# Patient Record
Sex: Male | Born: 1990 | Hispanic: Yes | Marital: Single | Smoking: Former smoker
Health system: Southern US, Community
[De-identification: ages and names within clinical notes are randomized; demographics above are authoritative.]

## PROBLEM LIST (undated history)

## (undated) DIAGNOSIS — E7800 Pure hypercholesterolemia, unspecified: Secondary | ICD-10-CM

## (undated) DIAGNOSIS — I10 Essential (primary) hypertension: Secondary | ICD-10-CM

---

## 2015-08-19 ENCOUNTER — Emergency Department
Admission: EM | Admit: 2015-08-19 | Discharge: 2015-08-19 | Disposition: A | Discharge status: Home / Self Care | Payer: Self-pay | Attending: Emergency Medicine | Admitting: Emergency Medicine

## 2015-08-19 ENCOUNTER — Encounter: Payer: Self-pay | Admitting: Emergency Medicine

## 2015-08-19 ENCOUNTER — Emergency Department: Payer: Self-pay

## 2015-08-19 DIAGNOSIS — R0789 Other chest pain: Secondary | ICD-10-CM | POA: Insufficient documentation

## 2015-08-19 DIAGNOSIS — F17200 Nicotine dependence, unspecified, uncomplicated: Secondary | ICD-10-CM | POA: Insufficient documentation

## 2015-08-19 LAB — CBC
HEMATOCRIT: 46.1 % (ref 40.0–52.0)
Hemoglobin: 16.3 g/dL (ref 13.0–18.0)
MCH: 30.4 pg (ref 26.0–34.0)
MCHC: 35.4 g/dL (ref 32.0–36.0)
MCV: 86.1 fL (ref 80.0–100.0)
Platelets: 164 10*3/uL (ref 150–440)
RBC: 5.35 MIL/uL (ref 4.40–5.90)
RDW: 13.3 % (ref 11.5–14.5)
WBC: 7.9 10*3/uL (ref 3.8–10.6)

## 2015-08-19 LAB — BASIC METABOLIC PANEL
Anion gap: 6 (ref 5–15)
BUN: 16 mg/dL (ref 6–20)
CO2: 28 mmol/L (ref 22–32)
Calcium: 10.3 mg/dL (ref 8.9–10.3)
Chloride: 106 mmol/L (ref 101–111)
Creatinine, Ser: 0.86 mg/dL (ref 0.61–1.24)
GFR calc Af Amer: >60 mL/min (ref >60)
GLUCOSE: 95 mg/dL (ref 65–99)
POTASSIUM: 4.4 mmol/L (ref 3.5–5.1)
Sodium: 140 mmol/L (ref 135–145)

## 2015-08-19 LAB — TROPONIN I: Troponin I: <0.03 ng/mL (ref <0.03)

## 2015-08-19 MED ORDER — NAPROXEN 500 MG PO TABS: 500.0000 mg | ORAL_TABLET | Freq: Two times a day (BID) | ORAL | 0 refills | Status: DC

## 2015-08-19 NOTE — ED Notes (Signed)
Awaiting interpreter

## 2015-08-19 NOTE — ED Notes (Signed)
Waiting for interpreter for discharge

## 2015-08-19 NOTE — ED Provider Notes (Signed)
[facility] Emergency Department Provider Note  ____________________________________________  Time seen: Approximately 3:49 PM  I have reviewed the triage vital signs and the nursing notes.   HISTORY  Chief Complaint Chest Pain  Level 5 caveat:  Portions of the history and physical were unable to be obtained due to the patient's poor historian Interviewed and examined with Spanish interpreter   HPI Zachary Beck Fiscal is a 25 y.o. male complains of constant chest pain that is sharp and worse with breathing and movement for the past 6 hours. Gradual onset. He was at work Surveyor, mining at the time of onset. He does repetitive arm motions and lives heavy objects above his head frequently while working at [employer]. Denies any exertional symptoms. Does report some palpitations, no vomiting or significant diaphoresis. No specific shortness of breath.     History reviewed. No pertinent past medical history.   There are no active problems to display for this patient.    History reviewed. No pertinent surgical history.   Current Outpatient Rx  . Order #: [id number] Class: Print     Allergies Review of patient's allergies indicates no known allergies.   No family history on file.  Social History Social History  Substance Use Topics  . Smoking status: Current Some Day Smoker  . Smokeless tobacco: Never Used  . Alcohol use Yes    Review of Systems  Constitutional:   No fever or chills.  ENT:   No sore throat. No rhinorrhea. Cardiovascular:   Positive as above chest pain. Respiratory:   No dyspnea or cough. Gastrointestinal:   Negative for abdominal pain, vomiting and diarrhea.  Genitourinary:   Negative for dysuria or difficulty urinating. Musculoskeletal:   Negative for focal pain or swelling Neurological:   Negative for headaches 10-point ROS otherwise negative.  ____________________________________________   PHYSICAL  EXAM:  VITAL SIGNS: ED Triage Vitals  Enc Vitals Group     BP 08/19/15 1129 (!) 143/85     Pulse Rate 08/19/15 1129 97     Resp 08/19/15 1129 20     Temp 08/19/15 1129 98.8 F (37.1 C)     Temp Source 08/19/15 1129 Oral     SpO2 08/19/15 1129 97 %     Weight --      Height --      Head Circumference --      Peak Flow --      Pain Score 08/19/15 1144 8     Pain Loc --      Pain Edu? --      Excl. in [place]? --     Vital signs reviewed, nursing assessments reviewed.   Constitutional:   Alert and oriented. Well appearing and in no distress. Eyes:   No scleral icterus. No conjunctival pallor. PERRL. EOMI.  No nystagmus. ENT   Head:   Normocephalic and atraumatic.   Nose:   No congestion/rhinnorhea. No septal hematoma   Mouth/Throat:   MMM, no pharyngeal erythema. No peritonsillar mass.    Neck:   No stridor. No SubQ emphysema. No meningismus. Hematological/Lymphatic/Immunilogical:   No cervical lymphadenopathy. Cardiovascular:   RRR heart rate 90. Symmetric bilateral radial and DP pulses.  No murmurs.  Respiratory:   Normal respiratory effort without tachypnea nor retractions. Breath sounds are clear and equal bilaterally. No wheezes/rales/rhonchi. Gastrointestinal:   Soft and nontender. Non distended. There is no CVA tenderness.  No rebound, rigidity, or guarding. Genitourinary:   deferred Musculoskeletal:   Left anterior chest  wall very tender to the touch which reproduces his symptoms. Additionally the symptoms can be elicited and aggravated by having the patient tried to adduct and internally rotate the arm against resistance from a externally rotated abducted position. Neurologic:   Normal speech and language.  CN 2-10 normal. Motor grossly intact. No gross focal neurologic deficits are appreciated.  Skin:    Skin is warm, dry and intact. No rash noted.  No petechiae, purpura, or bullae.  ____________________________________________    LABS (pertinent  positives/negatives) (all labs ordered are listed, but only abnormal results are displayed) Labs Reviewed  BASIC METABOLIC PANEL  CBC  TROPONIN I   ____________________________________________   EKG  Interpreted by me Sinus tachycardia rate 104, normal axis intervals QRS ST segments and T waves  ____________________________________________    RADIOLOGY  X-ray unremarkable  ____________________________________________   PROCEDURES Procedures  ____________________________________________   INITIAL IMPRESSION / ASSESSMENT AND PLAN / ED COURSE  Pertinent labs & imaging results that were available during my care of the patient were reviewed by me and considered in my medical decision making (see chart for details).  Patient well appearing no acute distress. Presents with chest pain which is atypical in nature and very reproducible on exam consistent with chest wall pain. NSAIDs, follow-up with primary care. Work note provided.Considering the patient's symptoms, medical history, and physical examination today, I have low suspicion for ACS, PE, TAD, pneumothorax, carditis, mediastinitis, pneumonia, CHF, or sepsis.       Clinical Course   ____________________________________________   FINAL CLINICAL IMPRESSION(S) / ED DIAGNOSES  Final diagnoses:  Chest wall pain       Portions of this note were generated with dragon dictation software. Dictation errors may occur despite best attempts at proofreading.    Sharman Cheek, MD 08/19/15 [phone]

## 2015-08-19 NOTE — ED Triage Notes (Signed)
Per interpreter:  Pt c/o chest pain for about three mths and presents today with stronger pain on the left side of chest.  Pt shortness of breath and diaphoresis today while working.

## 2016-06-04 ENCOUNTER — Emergency Department
Admission: EM | Admit: 2016-06-04 | Discharge: 2016-06-05 | Disposition: A | Discharge status: Home / Self Care | Payer: No Typology Code available for payment source | Attending: Emergency Medicine | Admitting: Emergency Medicine

## 2016-06-04 ENCOUNTER — Emergency Department: Payer: No Typology Code available for payment source

## 2016-06-04 ENCOUNTER — Encounter: Payer: Self-pay | Admitting: Emergency Medicine

## 2016-06-04 DIAGNOSIS — F17200 Nicotine dependence, unspecified, uncomplicated: Secondary | ICD-10-CM | POA: Insufficient documentation

## 2016-06-04 DIAGNOSIS — S199XXA Unspecified injury of neck, initial encounter: Secondary | ICD-10-CM | POA: Diagnosis present

## 2016-06-04 DIAGNOSIS — Y939 Activity, unspecified: Secondary | ICD-10-CM | POA: Diagnosis not present

## 2016-06-04 DIAGNOSIS — Y999 Unspecified external cause status: Secondary | ICD-10-CM | POA: Insufficient documentation

## 2016-06-04 DIAGNOSIS — M542 Cervicalgia: Secondary | ICD-10-CM | POA: Diagnosis not present

## 2016-06-04 DIAGNOSIS — M25511 Pain in right shoulder: Secondary | ICD-10-CM | POA: Diagnosis not present

## 2016-06-04 DIAGNOSIS — M7918 Myalgia, other site: Secondary | ICD-10-CM

## 2016-06-04 DIAGNOSIS — Y92410 Unspecified street and highway as the place of occurrence of the external cause: Secondary | ICD-10-CM | POA: Diagnosis not present

## 2016-06-04 MED ORDER — KETOROLAC TROMETHAMINE 10 MG PO TABS: 10.0000 mg | ORAL_TABLET | Freq: Three times a day (TID) | ORAL | 0 refills | Status: DC

## 2016-06-04 MED ORDER — CYCLOBENZAPRINE HCL 5 MG PO TABS: 5.0000 mg | ORAL_TABLET | Freq: Three times a day (TID) | ORAL | 0 refills | Status: DC | PRN

## 2016-06-04 MED ORDER — KETOROLAC TROMETHAMINE 30 MG/ML IJ SOLN
30.0000 mg | Freq: Once | INTRAMUSCULAR | Status: AC
  Administered 2016-06-04: 30 mg via INTRAVENOUS
  Filled 2016-06-04: qty 1

## 2016-06-04 MED ORDER — ORPHENADRINE CITRATE 30 MG/ML IJ SOLN
60.0000 mg | INTRAMUSCULAR | Status: AC
  Administered 2016-06-04: 60 mg via INTRAMUSCULAR
  Filled 2016-06-04: qty 2

## 2016-06-04 NOTE — Discharge Instructions (Signed)
Your exam and x-rays are normal at this time. You may continue to experience some muscle soreness over the next few days. Take the prescription meds as directed. Apply ice to any sore muscles. Follow-up with [facility] as needed.

## 2016-06-04 NOTE — ED Notes (Signed)
Interpreter requested 
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
## 2019-02-17 ENCOUNTER — Ambulatory Visit: Payer: Self-pay | Attending: Internal Medicine

## 2019-02-17 DIAGNOSIS — Z20822 Contact with and (suspected) exposure to covid-19: Secondary | ICD-10-CM | POA: Insufficient documentation

## 2019-02-18 ENCOUNTER — Telehealth: Payer: Self-pay | Admitting: *Deleted

## 2019-02-18 LAB — NOVEL CORONAVIRUS, NAA: SARS-CoV-2, NAA: NOT DETECTED

## 2019-02-18 NOTE — Telephone Encounter (Signed)
Patient called given negative covid results also requested results sre faxed to his employer [employer] at [phone].

## 2021-03-09 ENCOUNTER — Emergency Department: Payer: Self-pay

## 2021-03-09 ENCOUNTER — Emergency Department
Admission: EM | Admit: 2021-03-09 | Discharge: 2021-03-09 | Disposition: A | Discharge status: Home / Self Care | Payer: Self-pay | Attending: Emergency Medicine | Admitting: Emergency Medicine

## 2021-03-09 ENCOUNTER — Other Ambulatory Visit: Payer: Self-pay

## 2021-03-09 ENCOUNTER — Encounter: Payer: Self-pay | Admitting: Intensive Care

## 2021-03-09 DIAGNOSIS — R809 Proteinuria, unspecified: Secondary | ICD-10-CM

## 2021-03-09 DIAGNOSIS — R1084 Generalized abdominal pain: Secondary | ICD-10-CM

## 2021-03-09 DIAGNOSIS — A0811 Acute gastroenteropathy due to Norwalk agent: Secondary | ICD-10-CM

## 2021-03-09 DIAGNOSIS — I10 Essential (primary) hypertension: Secondary | ICD-10-CM

## 2021-03-09 HISTORY — DX: Pure hypercholesterolemia, unspecified: E78.00

## 2021-03-09 HISTORY — DX: Essential (primary) hypertension: I10

## 2021-03-09 LAB — COMPREHENSIVE METABOLIC PANEL
ALT: 17 U/L (ref 0–44)
AST: 21 U/L (ref 15–41)
Albumin: 4.1 g/dL (ref 3.5–5.0)
Alkaline Phosphatase: 102 U/L (ref 38–126)
Anion gap: 7 (ref 5–15)
BUN: 16 mg/dL (ref 6–20)
CO2: 25 mmol/L (ref 22–32)
Calcium: 8.9 mg/dL (ref 8.9–10.3)
Chloride: 105 mmol/L (ref 98–111)
Creatinine, Ser: 0.85 mg/dL (ref 0.61–1.24)
GFR, Estimated: >60 mL/min (ref >60)
Glucose, Bld: 123 mg/dL — ABNORMAL HIGH (ref 70–99)
Potassium: 3.5 mmol/L (ref 3.5–5.1)
Sodium: 137 mmol/L (ref 135–145)
Total Bilirubin: 0.8 mg/dL (ref 0.3–1.2)
Total Protein: 7.8 g/dL (ref 6.5–8.1)

## 2021-03-09 LAB — CBC WITH DIFFERENTIAL/PLATELET
Abs Immature Granulocytes: 0.01 10*3/uL (ref 0.00–0.07)
Basophils Absolute: 0 10*3/uL (ref 0.0–0.1)
Basophils Relative: 0 %
Eosinophils Absolute: 0.4 10*3/uL (ref 0.0–0.5)
Eosinophils Relative: 7 %
HCT: 46 % (ref 39.0–52.0)
Hemoglobin: 15.1 g/dL (ref 13.0–17.0)
Immature Granulocytes: 0 %
Lymphocytes Relative: 24 %
Lymphs Abs: 1.4 10*3/uL (ref 0.7–4.0)
MCH: 27.6 pg (ref 26.0–34.0)
MCHC: 32.8 g/dL (ref 30.0–36.0)
MCV: 83.9 fL (ref 80.0–100.0)
Monocytes Absolute: 0.5 10*3/uL (ref 0.1–1.0)
Monocytes Relative: 9 %
Neutro Abs: 3.7 10*3/uL (ref 1.7–7.7)
Neutrophils Relative %: 60 %
Platelets: 214 10*3/uL (ref 150–400)
RBC: 5.48 MIL/uL (ref 4.22–5.81)
RDW: 13.1 % (ref 11.5–15.5)
WBC: 6.1 10*3/uL (ref 4.0–10.5)
nRBC: 0 % (ref 0.0–0.2)

## 2021-03-09 LAB — URINALYSIS, ROUTINE W REFLEX MICROSCOPIC
Bacteria, UA: NONE SEEN
Bilirubin Urine: NEGATIVE
Glucose, UA: NEGATIVE mg/dL
Hgb urine dipstick: NEGATIVE
Ketones, ur: NEGATIVE mg/dL
Leukocytes,Ua: NEGATIVE
Nitrite: NEGATIVE
Protein, ur: >=300 mg/dL — AB
Specific Gravity, Urine: 1.027 (ref 1.005–1.030)
pH: 5 (ref 5.0–8.0)

## 2021-03-09 LAB — GASTROINTESTINAL PANEL BY PCR, STOOL (REPLACES STOOL CULTURE)

## 2021-03-09 LAB — C DIFFICILE QUICK SCREEN W PCR REFLEX
C Diff antigen: NEGATIVE
C Diff interpretation: NOT DETECTED
C Diff toxin: NEGATIVE

## 2021-03-09 LAB — LIPASE, BLOOD: Lipase: 33 U/L (ref 11–51)

## 2021-03-09 MED ORDER — LACTATED RINGERS IV BOLUS
1000.0000 mL | Freq: Once | INTRAVENOUS | Status: AC
  Administered 2021-03-09: 1000 mL via INTRAVENOUS

## 2021-03-09 NOTE — ED Notes (Signed)
EDP at [facility] with [place] interpreter. Plan explained. Attempting BM at this time. Aware of need for urine and stool sample. Pt alert, NAD, calm, interactive resps e/u, steady gait.

## 2021-03-09 NOTE — Discharge Instructions (Addendum)
Please be sure to follow-up with your doctor.  Your blood pressure is high and there is protein in your urine.  This may be due to kidney damage beginning to be caused by the high blood pressure.  You have an infection with norovirus.  This causes diarrhea but only last about a week or so.  It should go away and you should feel better.  Please return if you are having a lot of vomiting and cannot keep down fluids or worsening pain or fever.  Please follow-up with your doctor in about a week.

## 2021-03-09 NOTE — ED Provider Notes (Signed)
[facility] Provider Note    Event Date/Time   First MD Initiated Contact with Patient 03/09/21 1552     (approximate)   History   Abdominal Pain   HPI  Zachary Beck is a 31 y.o. male who came in quite sometime ago.  The computer has now lost this patient's note 4 times in a row.  I am doing this from memory.  As best as I can remember he came in complaining of abdominal pain with diarrhea.  The pain was diffuse and mild.  He stopped his blood pressure medication sometime ago and has not taken it again.  Patient was able to supply [place] with a stool specimen which showed norovirus.      Physical Exam   Triage Vital Signs: ED Triage Vitals  Enc Vitals Group     BP 03/09/21 1543 (!) 175/102     Pulse Rate 03/09/21 1543 85     Resp 03/09/21 1543 16     Temp 03/09/21 1543 98.4 F (36.9 C)     Temp Source 03/09/21 1543 Oral     SpO2 03/09/21 1543 97 %     Weight 03/09/21 1545 213 lb (96.6 kg)     Height --      Head Circumference --      Peak Flow --      Pain Score 03/09/21 1544 5     Pain Loc --      Pain Edu? --      Excl. in [place]? --     Most recent vital signs: Vitals:   03/09/21 1831 03/09/21 1930  BP:  (!) 124/95  Pulse: 73 66  Resp:  17  Temp:    SpO2: 98% 99%    { General: Awake, no distress.  CV:  Good peripheral perfusion.  Heart regular rate and rhythm no audible murmurs Resp:  Normal effort.  Lungs are clear Abd:  No distention.  Patient complains of mild pain but none with palpation.    ED Results / Procedures / Treatments   Labs (all labs ordered are listed, but only abnormal results are displayed) Labs Reviewed  GASTROINTESTINAL PANEL BY PCR, STOOL (REPLACES STOOL CULTURE) - Abnormal; Notable for the following components:      Result Value   Norovirus GI/GII DETECTED (*)    All other components within normal limits  COMPREHENSIVE METABOLIC PANEL - Abnormal; Notable for the following components:    Glucose, Bld 123 (*)    All other components within normal limits  URINALYSIS, ROUTINE W REFLEX MICROSCOPIC - Abnormal; Notable for the following components:   Color, Urine YELLOW (*)    APPearance CLEAR (*)    Protein, ur >=300 (*)    All other components within normal limits  C DIFFICILE QUICK SCREEN W PCR REFLEX    LIPASE, BLOOD  CBC WITH DIFFERENTIAL/PLATELET     EKG     RADIOLOGY Flat and upright abdomen with chest are negative radiology read the film and I reviewed them   PROCEDURES:  Critical Care performed:   Procedures   MEDICATIONS ORDERED IN ED: Medications  lactated ringers bolus 1,000 mL (0 mLs Intravenous Stopped 03/09/21 1929)     IMPRESSION / MDM / [place] / ED COURSE  I reviewed the triage vital signs and the nursing notes. Patient was able to provide a stool specimen.  It is negative for C. difficile.  He is positive for norovirus.  Additionally his  urinalysis shows greater than 300 protein.  This is somewhat worrisome as his blood pressures been elevated.  I have told him about the norovirus.  I will have him follow-up with his regular doctor return if he has any further problems.  I have told him that he needs to get his blood pressure medications restarted as it seems like he is getting kidney damage from the blood pressure. Patient is doing well here.  His sugars mildly elevated but his other electrolytes etc. are normal and his white count is normal.  His urine is otherwise negative.  His belly pain is mild.         FINAL CLINICAL IMPRESSION(S) / ED DIAGNOSES   Final diagnoses:  Proteinuria, unspecified type  Hypertension, unspecified type  Infection due to Norovirus species  Generalized abdominal pain     Rx / DC Orders   ED Discharge Orders     None        Note:  This document was prepared using Dragon voice recognition software and may include unintentional dictation errors.   Nena Polio, MD 03/10/21  Shelah Lewandowsky

## 2021-03-09 NOTE — ED Provider Notes (Signed)
[facility] Provider Note    Event Date/Time   First MD Initiated Contact with Patient 03/09/21 1552     (approximate)   History   Abdominal Pain   HPI  Zachary Beck is a 31 y.o. male who reports 4 days of abdominal pain and some diarrhea.  He reports epigastric abdominal pain and some diffuse achy pain.  This pain is sometimes worse when he eats.  It does not feel crampy.  He is not having any blood in his stool.  He is not running any fever.  He is not having any vomiting.      Physical Exam   Triage Vital Signs: ED Triage Vitals  Enc Vitals Group     BP 03/09/21 1543 (!) 175/102     Pulse Rate 03/09/21 1543 85     Resp 03/09/21 1543 16     Temp 03/09/21 1543 98.4 F (36.9 C)     Temp Source 03/09/21 1543 Oral     SpO2 03/09/21 1543 97 %     Weight 03/09/21 1545 213 lb (96.6 kg)     Height --      Head Circumference --      Peak Flow --      Pain Score 03/09/21 1544 5     Pain Loc --      Pain Edu? --      Excl. in [place]? --     Most recent vital signs: Vitals:   03/09/21 1831 03/09/21 1930  BP:  (!) 124/95  Pulse: 73 66  Resp:  17  Temp:    SpO2: 98% 99%     General: Awake, no distress.  CV:  Good peripheral perfusion.  Heart regular rate and rhythm no audible murmurs Resp:  Normal effort.  Lungs are clear Abd:  No distention.  Abdomen is soft bowel sounds are positive there is some epigastric tenderness but very mild Extremities: No edema   ED Results / Procedures / Treatments   Labs (all labs ordered are listed, but only abnormal results are displayed) Labs Reviewed  GASTROINTESTINAL PANEL BY PCR, STOOL (REPLACES STOOL CULTURE) - Abnormal; Notable for the following components:      Result Value   Norovirus GI/GII DETECTED (*)    All other components within normal limits  COMPREHENSIVE METABOLIC PANEL - Abnormal; Notable for the following components:   Glucose, Bld 123 (*)    All other components within  normal limits  URINALYSIS, ROUTINE W REFLEX MICROSCOPIC - Abnormal; Notable for the following components:   Color, Urine YELLOW (*)    APPearance CLEAR (*)    Protein, ur >=300 (*)    All other components within normal limits  C DIFFICILE QUICK SCREEN W PCR REFLEX    LIPASE, BLOOD  CBC WITH DIFFERENTIAL/PLATELET     EKG     RADIOLOGY X-ray of the chest and abdomen does not show any acute problems   PROCEDURES:  Critical Care performed:   Procedures   MEDICATIONS ORDERED IN ED: Medications  lactated ringers bolus 1,000 mL (0 mLs Intravenous Stopped 03/09/21 1929)     IMPRESSION / MDM / ASSESSMENT AND PLAN / ED COURSE  I reviewed the triage vital signs and the nursing notes. Patient's stool specimen is positive for norovirus.  His labs are normal and he x-rays are also normal.  The pain he describes and his exam are not consistent with any serious problem other than likely the norovirus infection.  I will let him go have him return for any worsening and follow-up with his doctor in about a week.  He has no signs of bacterial infection in his CBC or dehydration or electrolyte imbalance or liver problems in the CMP or other medical problems except for the proteinuria which is likely from his hypertension.  He will need to follow-up with his doctor for that and I have put that in the discharge instructions.     The patient is on the cardiac monitor to evaluate for evidence of arrhythmia and/or significant heart rate changes.  None have been seen    FINAL CLINICAL IMPRESSION(S) / ED DIAGNOSES   Final diagnoses:  Proteinuria, unspecified type  Hypertension, unspecified type  Infection due to Norovirus species  Generalized abdominal pain     Rx / DC Orders   ED Discharge Orders     None        Note:  This document was prepared using Dragon voice recognition software and may include unintentional dictation errors.   Nena Polio, MD 03/09/21 2016

## 2021-03-09 NOTE — ED Triage Notes (Addendum)
Interpretor used for triage. Patient c/o abdominal pain with diarrhea. Reports he had been prescribed blood pressure medication in the past but quit going to his clinic for checkups.

## 2022-06-29 ENCOUNTER — Emergency Department
Admission: EM | Admit: 2022-06-29 | Discharge: 2022-06-29 | Disposition: A | Discharge status: Home / Self Care | Payer: Self-pay | Attending: Emergency Medicine | Admitting: Emergency Medicine

## 2022-06-29 ENCOUNTER — Emergency Department: Payer: Self-pay

## 2022-06-29 ENCOUNTER — Encounter: Payer: Self-pay | Admitting: Emergency Medicine

## 2022-06-29 ENCOUNTER — Other Ambulatory Visit: Payer: Self-pay

## 2022-06-29 DIAGNOSIS — I10 Essential (primary) hypertension: Secondary | ICD-10-CM | POA: Insufficient documentation

## 2022-06-29 DIAGNOSIS — Z1152 Encounter for screening for COVID-19: Secondary | ICD-10-CM | POA: Insufficient documentation

## 2022-06-29 DIAGNOSIS — J029 Acute pharyngitis, unspecified: Secondary | ICD-10-CM | POA: Insufficient documentation

## 2022-06-29 LAB — SARS CORONAVIRUS 2 BY RT PCR: SARS Coronavirus 2 by RT PCR: NEGATIVE

## 2022-06-29 LAB — GROUP A STREP BY PCR: Group A Strep by PCR: NOT DETECTED

## 2022-06-29 MED ORDER — PANTOPRAZOLE SODIUM 40 MG PO TBEC: 40.0000 mg | DELAYED_RELEASE_TABLET | Freq: Every day | ORAL | 1 refills | Status: AC

## 2022-06-29 MED ORDER — AMOXICILLIN 875 MG PO TABS: 875.0000 mg | ORAL_TABLET | Freq: Two times a day (BID) | ORAL | 0 refills | Status: AC

## 2022-06-29 NOTE — ED Triage Notes (Signed)
Per interpreter, pt reports some soreness with swallowing and pain to his right ear. Pt reports pain starts in throat and moves into his ear. Pt reports has been happening for a month and now is starting to have a fever. Pt reports uses OTC meds for the fever

## 2022-06-29 NOTE — ED Provider Notes (Signed)
[facility] Provider Note    Event Date/Time   First MD Initiated Contact with Patient 06/29/22 1029     (approximate)   History   Otalgia and Sore Throat   HPI  [name] is a 32 y.o. male with hx of htn presents with sore throat and pain in right ear for approx 1 month, states it hurts to drink cold water and the right side of his neck will swell sometimes, some hoarseness.  No fever/chills/cp/sob      Physical Exam   Triage Vital Signs: ED Triage Vitals  Enc Vitals Group     BP 06/29/22 1009 (!) 155/94     Pulse Rate 06/29/22 1009 88     Resp 06/29/22 1009 20     Temp 06/29/22 1012 98.4 F (36.9 C)     Temp Source 06/29/22 1012 Oral     SpO2 06/29/22 1009 96 %     Weight 06/29/22 1010 211 lb 10.3 oz (96 kg)     Height 06/29/22 1010 5\' 7"  (1.702 m)     Head Circumference --      Peak Flow --      Pain Score 06/29/22 1010 8     Pain Loc --      Pain Edu? --      Excl. in GC? --     Most recent vital signs: Vitals:   06/29/22 1009 06/29/22 1012  BP: (!) 155/94   Pulse: 88   Resp: 20   Temp:  98.4 F (36.9 C)  SpO2: 96%      General: Awake, no distress.   CV:  Good peripheral perfusion. regular rate and  rhythm Resp:  Normal effort. Lungs cta Abd:  No distention.   Other:  ENT: TMs clear bilaterally, throat appears to be irritated, voice is hoarse, neck is supple, some tenderness along the right tonsillar gland, no swelling noted   ED Results / Procedures / Treatments   Labs (all labs ordered are listed, but only abnormal results are displayed) Labs Reviewed  GROUP A STREP BY PCR  SARS CORONAVIRUS 2 BY RT PCR     EKG     RADIOLOGY Soft tissue of the neck    PROCEDURES:   Procedures   MEDICATIONS ORDERED IN ED: Medications - No data to display   IMPRESSION / MDM / ASSESSMENT AND PLAN / ED COURSE  I reviewed the triage vital signs and the nursing notes.                               Differential diagnosis includes, but is not limited to, strep, GERD, salivary stone  Patient's presentation is most consistent with acute complicated illness / injury requiring diagnostic workup.   Labs and imaging ordered  Strep test reassuring   Strep and respiratory panel reassuring  X-ray soft tissue of the neck is reassuring, independently reviewed and interpreted by me  Did explain the findings to the patient.  Treat him with a antibiotic and reflux medication.  He is to follow-up with the ENT.  Possible for this to be a salivary stone due to the intermittent swelling.  He is in agreement treatment plan.  He was discharged stable condition.   FINAL CLINICAL IMPRESSION(S) / ED DIAGNOSES   Final diagnoses:  Pharyngitis, unspecified etiology     Rx / DC Orders   ED Discharge Orders  Ordered    amoxicillin (AMOXIL) 875 MG tablet  2 times daily        06/29/22 1207    pantoprazole (PROTONIX) 40 MG tablet  Daily        06/29/22 1207             Note:  This document was prepared using Dragon voice recognition software and may include unintentional dictation errors.    Faythe Ghee, PA-C 06/29/22 1215    Sharyn Creamer, MD 06/29/22 1345

## 2022-07-07 IMAGING — CR DG ABDOMEN ACUTE W/ 1V CHEST
1 series · 3 of 3 positions shown · non-contrast
Comparison: Chest radiograph dated 08/19/2015.

CLINICAL DATA: Abdominal pain.

EXAM:
DG ABDOMEN ACUTE WITH 1 VIEW CHEST

[Series 1: dg abd acute 2+v w 1v chest · 0.14mm/px · 3 of 3 slices shown]
[im 1/3]
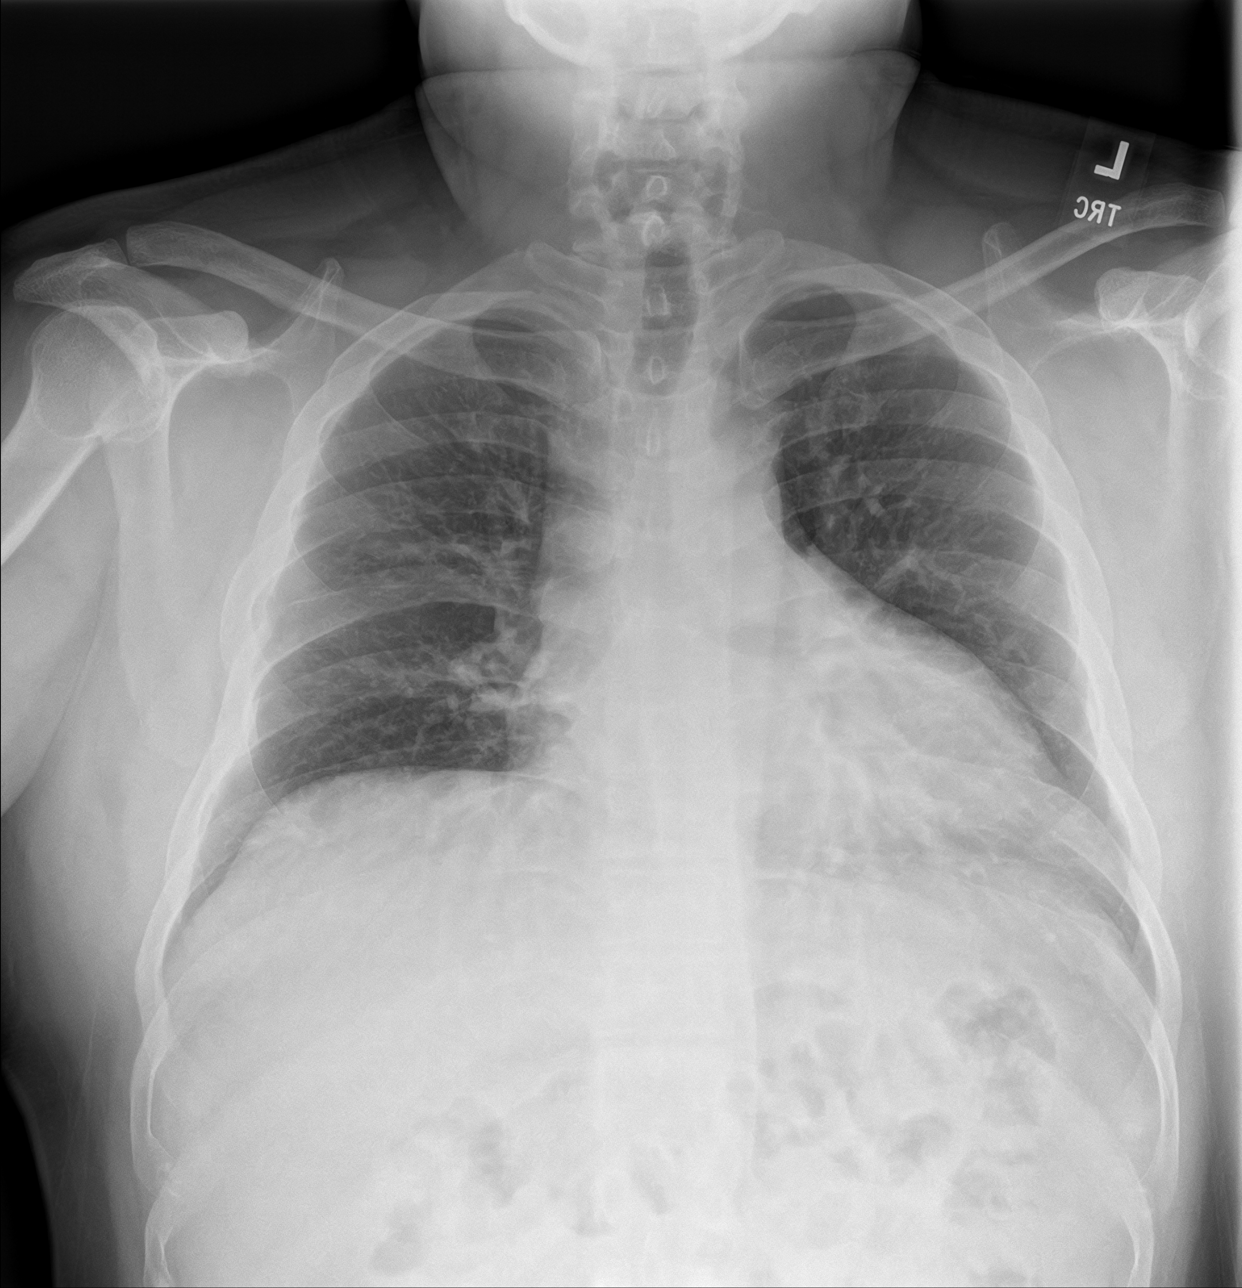
[im 2/3]
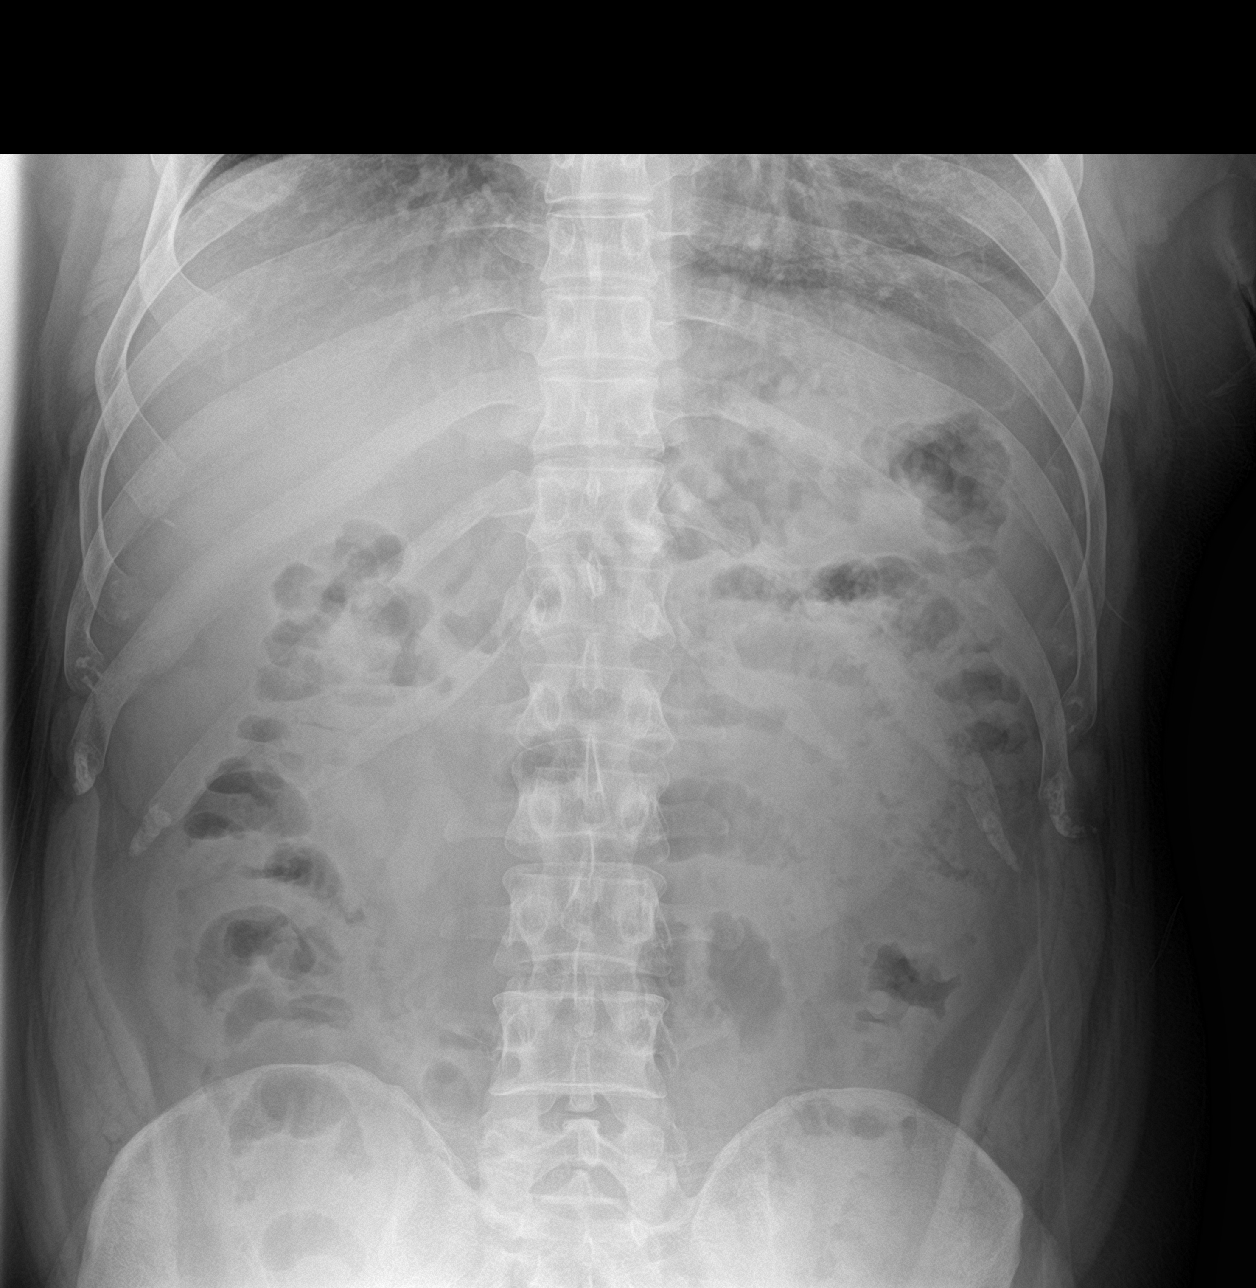
[im 3/3]
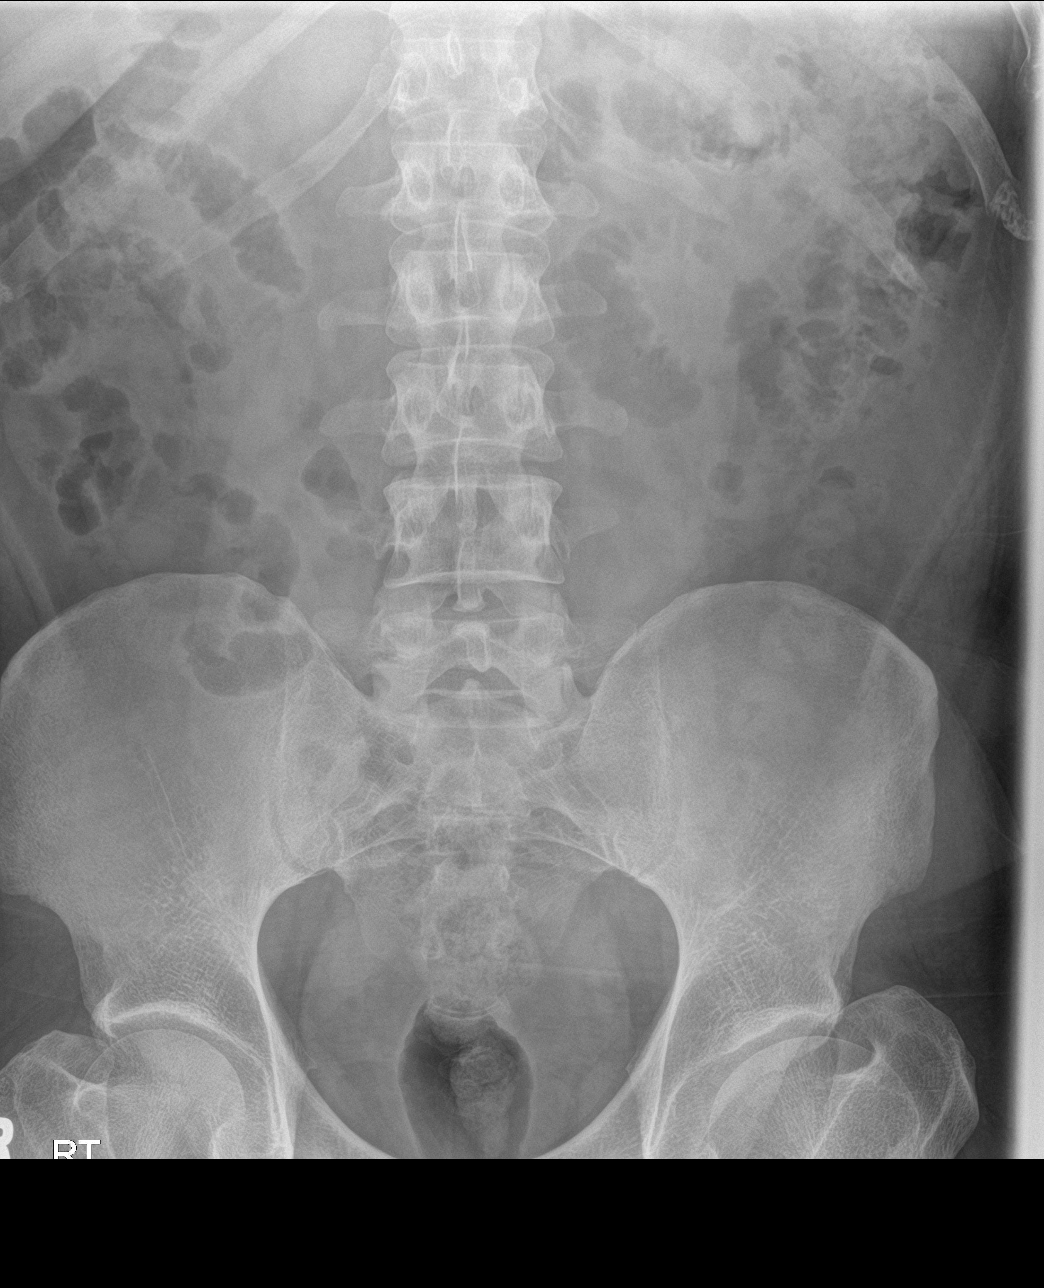

[3 of 3 positions shown; findings below may reference images not displayed]

FINDINGS: No focal consolidation, pleural effusion, or pneumothorax. Stable
cardiac silhouette.

No bowel dilatation or evidence of obstruction. No free air or
radiopaque calculi. The osseous structures are intact. The soft
tissues are unremarkable
IMPRESSION: Negative abdominal radiographs. No acute cardiopulmonary disease.

## 2023-07-15 ENCOUNTER — Emergency Department: Payer: Self-pay

## 2023-07-15 ENCOUNTER — Emergency Department
Admission: EM | Admit: 2023-07-15 | Discharge: 2023-07-15 | Disposition: A | Discharge status: Home / Self Care | Payer: Self-pay | Attending: Emergency Medicine | Admitting: Emergency Medicine

## 2023-07-15 ENCOUNTER — Other Ambulatory Visit: Payer: Self-pay

## 2023-07-15 DIAGNOSIS — R42 Dizziness and giddiness: Secondary | ICD-10-CM | POA: Insufficient documentation

## 2023-07-15 DIAGNOSIS — R519 Headache, unspecified: Secondary | ICD-10-CM | POA: Insufficient documentation

## 2023-07-15 DIAGNOSIS — R202 Paresthesia of skin: Secondary | ICD-10-CM | POA: Insufficient documentation

## 2023-07-15 LAB — COMPREHENSIVE METABOLIC PANEL WITH GFR
ALT: 38 U/L (ref 0–44)
AST: 43 U/L — ABNORMAL HIGH (ref 15–41)
Albumin: 4.1 g/dL (ref 3.5–5.0)
Alkaline Phosphatase: 70 U/L (ref 38–126)
Anion gap: 11 (ref 5–15)
BUN: 20 mg/dL (ref 6–20)
CO2: 23 mmol/L (ref 22–32)
Calcium: 8.9 mg/dL (ref 8.9–10.3)
Chloride: 104 mmol/L (ref 98–111)
Creatinine, Ser: 0.7 mg/dL (ref 0.61–1.24)
GFR, Estimated: >60 mL/min (ref >60)
Glucose, Bld: 124 mg/dL — ABNORMAL HIGH (ref 70–99)
Potassium: 3.9 mmol/L (ref 3.5–5.1)
Sodium: 138 mmol/L (ref 135–145)
Total Bilirubin: 0.6 mg/dL (ref 0.0–1.2)
Total Protein: 7.1 g/dL (ref 6.5–8.1)

## 2023-07-15 LAB — CBC WITH DIFFERENTIAL/PLATELET
Abs Immature Granulocytes: 0.03 10*3/uL (ref 0.00–0.07)
Basophils Absolute: 0 10*3/uL (ref 0.0–0.1)
Basophils Relative: 0 %
Eosinophils Absolute: 0.1 10*3/uL (ref 0.0–0.5)
Eosinophils Relative: 1 %
HCT: 40.4 % (ref 39.0–52.0)
Hemoglobin: 13.5 g/dL (ref 13.0–17.0)
Immature Granulocytes: 0 %
Lymphocytes Relative: 24 %
Lymphs Abs: 2.1 10*3/uL (ref 0.7–4.0)
MCH: 28 pg (ref 26.0–34.0)
MCHC: 33.4 g/dL (ref 30.0–36.0)
MCV: 83.6 fL (ref 80.0–100.0)
Monocytes Absolute: 0.6 10*3/uL (ref 0.1–1.0)
Monocytes Relative: 7 %
Neutro Abs: 5.7 10*3/uL (ref 1.7–7.7)
Neutrophils Relative %: 68 %
Platelets: 195 10*3/uL (ref 150–400)
RBC: 4.83 MIL/uL (ref 4.22–5.81)
RDW: 13.2 % (ref 11.5–15.5)
WBC: 8.5 10*3/uL (ref 4.0–10.5)
nRBC: 0 % (ref 0.0–0.2)

## 2023-07-15 MED ORDER — KETOROLAC TROMETHAMINE 15 MG/ML IJ SOLN
15.0000 mg | Freq: Once | INTRAMUSCULAR | Status: AC
  Administered 2023-07-15: 15 mg via INTRAVENOUS
  Filled 2023-07-15: qty 1

## 2023-07-15 MED ORDER — GADOBUTROL 1 MMOL/ML IV SOLN
10.0000 mL | Freq: Once | INTRAVENOUS | Status: AC | PRN
  Administered 2023-07-15: 10 mL via INTRAVENOUS

## 2023-07-15 NOTE — Discharge Instructions (Signed)
 Your testing today fortunately did not show an emergency cause for your symptoms.  Please arrange follow-up with neurology for further evaluation.  Return to the ER for new or worsening symptoms.

## 2023-07-15 NOTE — ED Provider Notes (Signed)
 [facility] Provider Note    Event Date/Time   First MD Initiated Contact with Patient 07/15/23 [phone]     (approximate)   History   Headache and Numbness   HPI  Zachary Beck is a 33 year old male presenting to the ER for multiple complaints.  He reports that about 2 weeks ago he was at work when he hit his head on a piece of metal machinery when standing up.  Had some initial dizziness, no LOC.  Not on anticoagulation.  Around that time, unsure if before or after he had onset of pins-and-needles intermittently in his bilateral legs.  Noted numbness in triage, but clarifies that this is not absent sensation rather paresthesias.  He has also noticed that he has diminished sensation over his right arm and leg compared to the contralateral side, thinks he may have had this in the past, but got worse about 2 weeks ago.  He has also had some intermittent blurred vision over the last week, denies double vision.  Reports ongoing headache over the right side of his head into his neck.  Denies low back pain.     Physical Exam   Triage Vital Signs: ED Triage Vitals [07/15/23 0752]  Encounter Vitals Group     BP (!) 174/103     Girls Systolic BP Percentile      Girls Diastolic BP Percentile      Boys Systolic BP Percentile      Boys Diastolic BP Percentile      Pulse Rate 83     Resp 18     Temp 98.3 F (36.8 C)     Temp Source Oral     SpO2 99 %     Weight      Height      Head Circumference      Peak Flow      Pain Score 8     Pain Loc      Pain Education      Exclude from Growth Chart     Most recent vital signs: Vitals:   07/15/23 0830 07/15/23 0900  BP: 112/72 135/86  Pulse: 77 75  Resp: (!) 22 19  Temp:    SpO2: 96% 93%     General: Awake, interactive  Head:  Atraumatic  Back:  Pain over the posterior neck without focal midline pain, no tenderness over the remainder of the spine CV:  Regular rate, good peripheral perfusion.   Resp:  Unlabored respirations.  Abd:  Nondistended.  Neuro:  Keenly aware, correctly answers month and age, able to blink eyes and squeeze hands, normal horizontal extraocular movements, no visual field loss, normal facial symmetry, no arm or leg motor drift, no limb ataxia, intact but diminished subjective sensation of the right arm and leg compared to the contralateral side, no aphasia, no dysarthria, no inattention. NIH 1    ED Results / Procedures / Treatments   Labs (all labs ordered are listed, but only abnormal results are displayed) Labs Reviewed  COMPREHENSIVE METABOLIC PANEL WITH GFR - Abnormal; Notable for the following components:      Result Value   Glucose, Bld 124 (*)    AST 43 (*)    All other components within normal limits  CBC WITH DIFFERENTIAL/PLATELET     EKG EKG independently reviewed and interpreted by myself demonstrates:  EKG demonstrates normal sinus rhythm at a rate of 75, PR 156, QRS 94, QTc 404, no acute ST changes  RADIOLOGY Imaging independently reviewed and interpreted by myself demonstrates:  CT head without acute bleed CT C-spine without acute fracture, MRI brain without acute CVA  Formal Radiology Read:  MR Brain W and Wo Contrast Result Date: 07/15/2023 CLINICAL DATA:  33 year old male status post blunt head trauma 2 weeks ago, neurologic deficit. EXAM: MRI HEAD WITHOUT AND WITH CONTRAST TECHNIQUE: Multiplanar, multiecho pulse sequences of the brain and surrounding structures were obtained without and with intravenous contrast. CONTRAST:  10mL GADAVIST GADOBUTROL 1 MMOL/ML IV SOLN COMPARISON:  CT head and cervical spine today. FINDINGS: Brain: Confirmed small left middle cranial fossa arachnoid cyst, CSF isointense and with no enhancement. Background brain volume is normal. No restricted diffusion to suggest acute infarction. No midline shift, mass effect, evidence of mass lesion, ventriculomegaly, or acute intracranial hemorrhage. Cervicomedullary  junction and pituitary are within normal limits. No convincing cerebral blood products on SWI. No cerebral edema or encephalomalacia. Solitary frontal lobe white matter T2 and FLAIR hyperintensity on the left series 9, image 33, nonspecific but within normal limits for this age group. Deep gray nuclei, brainstem, cerebellum appear negative. No abnormal enhancement identified.  No dural thickening. Vascular: Major intracranial vascular flow voids are preserved. Following contrast the major dural venous sinuses are enhancing and appear to be patent. Skull and upper cervical spine: Visualized bone marrow signal is within normal limits. Negative visible cervical spine. Sinuses/Orbits: Negative orbits. Paranasal Visualized paranasal sinuses and mastoids are stable and well aerated. Other: Visible internal auditory structures appear normal. Negative visible scalp and face. IMPRESSION: 1. No acute intracranial abnormality. 2. Confirmed small left middle cranial fossa arachnoid cyst, likely congenital and appears inconsequential. 3. Otherwise normal for age MRI appearance of the brain. Electronically Signed   By: Marlise Simpers M.D.   On: 07/15/2023 11:28   CT Cervical Spine Wo Contrast Result Date: 07/15/2023 CLINICAL DATA:  Neck trauma, focal neuro deficit or paresthesia (Age 28-64y) EXAM: CT CERVICAL SPINE WITHOUT CONTRAST TECHNIQUE: Multidetector CT imaging of the cervical spine was performed without intravenous contrast. Multiplanar CT image reconstructions were also generated. RADIATION DOSE REDUCTION: This exam was performed according to the departmental dose-optimization program which includes automated exposure control, adjustment of the mA and/or kV according to patient size and/or use of iterative reconstruction technique. COMPARISON:  Cervical spine series dated Jun 04, 2016. FINDINGS: Alignment: Normal. Skull base and vertebrae: No acute fracture. No primary bone lesion or focal pathologic process. Soft tissues and  spinal canal: No prevertebral fluid or swelling. No visible canal hematoma. There are few shotty cervical lymph nodes present bilaterally, which are likely reactive. Disc levels: The disc spaces are satisfactory preserved and the spinal canal and neural foramina appear to be widely patent. Upper chest: Negative. Other: None. IMPRESSION: Normal. Electronically Signed   By: Maribeth Shivers M.D.   On: 07/15/2023 09:25   CT Head Wo Contrast Result Date: 07/15/2023 CLINICAL DATA:  Head trauma, focal neuro findings (Age 45-64y) EXAM: CT HEAD WITHOUT CONTRAST TECHNIQUE: Contiguous axial images were obtained from the base of the skull through the vertex without intravenous contrast. RADIATION DOSE REDUCTION: This exam was performed according to the departmental dose-optimization program which includes automated exposure control, adjustment of the mA and/or kV according to patient size and/or use of iterative reconstruction technique. COMPARISON:  None Available. FINDINGS: Brain: Normal brain. No evidence of acute intracranial injury. Prominent fluid collection within the left middle cranial fossa anteriorly measuring approximately 3.9 x 1.5 x 1.7 cm, likely representing an arachnoid cyst. Vascular:  Negative. Skull: Intact and unremarkable. Sinuses/Orbits: No acute abnormality. Leftward deviation of the nasal septum with an osseous spur also projecting laterally to the left. Mild mucoperiosteal thickening present medially within the maxillary sinuses. The orbits are unremarkable. Other: None. IMPRESSION: 1. No evidence of acute traumatic injury. 2. Probable arachnoid cyst within the left middle cranial fossa. Electronically Signed   By: Maribeth Shivers M.D.   On: 07/15/2023 09:22    PROCEDURES:  Critical Care performed: No  Procedures   MEDICATIONS ORDERED IN ED: Medications  gadobutrol (GADAVIST) 1 MMOL/ML injection 10 mL (10 mLs Intravenous Contrast Given 07/15/23 1042)  ketorolac  (TORADOL ) 15 MG/ML  injection 15 mg (15 mg Intravenous Given 07/15/23 1123)     IMPRESSION / MDM / ASSESSMENT AND PLAN / ED COURSE  I reviewed the triage vital signs and the nursing notes.  Differential diagnosis includes, but is not limited to, intracranial bleed, concussion, complex migraine, electrolyte abnormality, hyperglycemia, anemia, CVA, intracranial mass  Patient's presentation is most consistent with acute presentation with potential threat to life or bodily function.  33 year old male presenting with headache with multiple neurologic symptoms and recent head trauma.  Hypertensive on presentation, improved without intervention.  Will start with labs and head CT.  If this is unremarkable, suspect patient will be appropriate for MRI for further evaluation.  Clinical Course as of 07/15/23 1202  Wed Jul 15, 2023  0915 CT Head Wo Contrast CT head without obvious bleed on my review.  CT C-spine without obvious fracture.  Formal radiology reads pending.  Will obtain MRI to further evaluate. [NR]  0933 CT Head Wo Contrast Radiology reads confirm no acute traumatic injury, does note likely arachnoid cyst. [NR]  1159 MR Brain W and Wo Contrast MRI brain redemonstrates arachnoid cyst without complication, no CVA, other acute findings.  Patient reassessed and is now reporting improvement in his headache, resolution of his paresthesias as well as a sensory deficit in between his left and right side.  With this history, consideration for complex migraine.  Will give information for follow-up with neurology.  Strict return precautions provided.  Patient discharged in stable condition. [NR]    Clinical Course User Index [NR] Claria Crofts, MD     FINAL CLINICAL IMPRESSION(S) / ED DIAGNOSES   Final diagnoses:  Acute nonintractable headache, unspecified headache type  Paresthesias     Rx / DC Orders   ED Discharge Orders     None        Note:  This document was prepared using Dragon voice recognition  software and may include unintentional dictation errors.   Claria Crofts, MD 07/15/23 [phone]

## 2023-07-15 NOTE — ED Notes (Signed)
 Pt here for 2 issues: hit their head on the left side at work (15 days ago) and they have pain on the left side of their head that does not allow him to sleep. The pain radiates down their neck and to their shoulder. Pt also states that they are pre-diabetic and have numbness in their legs (pins and needles) that has gotten worse in the last few weeks.

## 2023-07-15 NOTE — ED Triage Notes (Addendum)
 Pt here with a headache and leg numbness. Pt states he has had a headache x2 weeks and the bilateral leg numbness x1 week. Pt states at times the numbness can affect his walk. Pt also has not been taking his hypertension medication. Pt states he hit his head about 2 weeks ago but denies LOC. Pt denies NVD.
